# Patient Record
Sex: Male | Born: 1935 | Race: White | Hispanic: No | Marital: Married | Smoking: Never smoker
Health system: Southern US, Community
[De-identification: ages and names within clinical notes are randomized; demographics above are authoritative.]

## PROBLEM LIST (undated history)

## (undated) DIAGNOSIS — I341 Nonrheumatic mitral (valve) prolapse: Secondary | ICD-10-CM

## (undated) DIAGNOSIS — I10 Essential (primary) hypertension: Secondary | ICD-10-CM

## (undated) DIAGNOSIS — R55 Syncope and collapse: Secondary | ICD-10-CM

## (undated) DIAGNOSIS — I499 Cardiac arrhythmia, unspecified: Secondary | ICD-10-CM

## (undated) DIAGNOSIS — N189 Chronic kidney disease, unspecified: Secondary | ICD-10-CM

## (undated) DIAGNOSIS — N400 Enlarged prostate without lower urinary tract symptoms: Secondary | ICD-10-CM

## (undated) DIAGNOSIS — E785 Hyperlipidemia, unspecified: Secondary | ICD-10-CM

## (undated) HISTORY — DX: Nonrheumatic mitral (valve) prolapse: I34.1

## (undated) HISTORY — DX: Essential (primary) hypertension: I10

## (undated) HISTORY — DX: Hyperlipidemia, unspecified: E78.5

## (undated) HISTORY — PX: INSERT / REPLACE / REMOVE PACEMAKER: SUR710

## (undated) HISTORY — DX: Syncope and collapse: R55

## (undated) HISTORY — DX: Chronic kidney disease, unspecified: N18.9

## (undated) HISTORY — DX: Benign prostatic hyperplasia without lower urinary tract symptoms: N40.0

## (undated) HISTORY — DX: Cardiac arrhythmia, unspecified: I49.9

---

## 2009-05-15 ENCOUNTER — Encounter: Payer: Self-pay | Admitting: Internal Medicine

## 2009-10-28 ENCOUNTER — Encounter: Payer: Self-pay | Admitting: Internal Medicine

## 2010-03-06 ENCOUNTER — Encounter: Payer: Self-pay | Admitting: Internal Medicine

## 2010-03-17 ENCOUNTER — Encounter: Payer: Self-pay | Admitting: Internal Medicine

## 2010-04-02 ENCOUNTER — Ambulatory Visit: Payer: Self-pay | Admitting: Internal Medicine

## 2010-04-02 DIAGNOSIS — R55 Syncope and collapse: Secondary | ICD-10-CM

## 2010-04-02 DIAGNOSIS — I119 Hypertensive heart disease without heart failure: Secondary | ICD-10-CM

## 2010-04-06 ENCOUNTER — Ambulatory Visit: Payer: Self-pay | Admitting: Internal Medicine

## 2010-04-10 LAB — CONVERTED CEMR LAB
Basophils Absolute: 0 10*3/uL (ref 0.0–0.1)
CO2: 27 meq/L (ref 19–32)
Calcium: 9.1 mg/dL (ref 8.4–10.5)
Creatinine, Ser: 1.2 mg/dL (ref 0.4–1.5)
GFR calc non Af Amer: 61.79 mL/min (ref >60)
HCT: 29.6 % — ABNORMAL LOW (ref 39.0–52.0)
Hemoglobin: 10.2 g/dL — ABNORMAL LOW (ref 13.0–17.0)
INR: 1.1 — ABNORMAL HIGH (ref 0.8–1.0)
Lymphs Abs: 2.7 10*3/uL (ref 0.7–4.0)
MCHC: 34.6 g/dL (ref 30.0–36.0)
MCV: 93.4 fL (ref 78.0–100.0)
Monocytes Absolute: 0.6 10*3/uL (ref 0.1–1.0)
Monocytes Relative: 10.1 % (ref 3.0–12.0)
Neutro Abs: 2.4 10*3/uL (ref 1.4–7.7)
RDW: 14.6 % (ref 11.5–14.6)

## 2010-04-30 ENCOUNTER — Telehealth: Payer: Self-pay | Admitting: Internal Medicine

## 2010-05-18 ENCOUNTER — Encounter: Payer: Self-pay | Admitting: Internal Medicine

## 2010-06-11 ENCOUNTER — Telehealth: Payer: Self-pay | Admitting: Internal Medicine

## 2010-06-18 ENCOUNTER — Ambulatory Visit: Payer: Self-pay | Admitting: Internal Medicine

## 2010-06-22 ENCOUNTER — Encounter: Payer: Self-pay | Admitting: Internal Medicine

## 2010-06-29 ENCOUNTER — Ambulatory Visit: Payer: Self-pay | Admitting: Internal Medicine

## 2010-06-29 ENCOUNTER — Ambulatory Visit (HOSPITAL_COMMUNITY): Admission: RE | Admit: 2010-06-29 | Discharge: 2010-06-30 | Payer: Self-pay | Admitting: Internal Medicine

## 2010-07-03 ENCOUNTER — Encounter: Payer: Self-pay | Admitting: Internal Medicine

## 2010-07-09 ENCOUNTER — Encounter: Payer: Self-pay | Admitting: Internal Medicine

## 2010-07-09 ENCOUNTER — Ambulatory Visit: Payer: Self-pay

## 2010-10-06 ENCOUNTER — Encounter: Payer: Self-pay | Admitting: Internal Medicine

## 2010-10-06 ENCOUNTER — Ambulatory Visit
Admission: RE | Admit: 2010-10-06 | Discharge: 2010-10-06 | Payer: Self-pay | Source: Home / Self Care | Attending: Internal Medicine | Admitting: Internal Medicine

## 2010-10-06 DIAGNOSIS — Z950 Presence of cardiac pacemaker: Secondary | ICD-10-CM | POA: Insufficient documentation

## 2010-10-06 DIAGNOSIS — E785 Hyperlipidemia, unspecified: Secondary | ICD-10-CM | POA: Insufficient documentation

## 2010-10-20 NOTE — Cardiovascular Report (Signed)
Summary: Patient Implant Record Information   Patient Implant Record Information   Imported By: Roderic Ovens 07/20/2010 13:21:51  _____________________________________________________________________  External Attachment:    Type:   Image     Comment:   External Document

## 2010-10-20 NOTE — Assessment & Plan Note (Signed)
Summary: nep/irregular heartbeat/fainting spells/mt   CC:  new patient.  Irregular heart rate.  [facility]  History of Present Illness: Mr. Iiams is seen at the request of Arnette Felts because of recurrent syncope.  he is a 77 year old gentleman with a past history notable for hypertension who about 2 years ago began having abrupt episodes of syncope. It are occasionally associated with nausea and vomiting. They occurred occasionally upon standing. They also occur without warning. He is injured himself on a number of occasions. There is recovery fatigue  nausea and diaphoresis.  he has undergone extensive testing records of which were reviewed today. His echo showed normal left ventricular function. His Myoview showed normal perfusion and no scars. He also has been utilizing an MCXOT monitor which as demonstrated narrow QRS tachycardia probably AV nodal reentry, PVCs, sinus arrest with pauses of almost 5 seconds. He has had no tachycardia palpitations interestingly  He denies chest pain shortness of breath exercise tolerance or peripheral edema.  Current Medications (verified): 1)  Aspirin 81 Mg Tabs (Aspirin) .... Once Daily 2)  Amlodipine Besylate 5 Mg Tabs (Amlodipine Besylate) .... Take One Tablet Once Daily 3)  Niaspan 500 Mg Cr-Tabs (Niacin (Antihyperlipidemic)) .... Take 2 Tablets Every Evening 4)  Simvastatin 40 Mg Tabs (Simvastatin) .... Take One Tablet Every Evening 5)  Vitamin D3 1000 Unit Caps (Cholecalciferol) .... Once Daily 6)  Aspirin 81 Mg Tabs (Aspirin)  Allergies (verified): 1)  ! Pcn  Past History:  Past Medical History: Last updated: 04/01/2010 Syncope BPH hypertension atherosclerosis hyperlipidemia chronic kidney disease DM II mitral valve disorder dysrhythmia  Family History: Last updated: 04/02/2010 Negative FH of Diabetes, Hypertension, or Coronary Artery Disease  Social History: Last updated: 04/02/2010 Tobacco Use - No.  Alcohol Use - no married with  7 children  Family History: Negative FH of Diabetes, Hypertension, or Coronary Artery Disease  Social History: Tobacco Use - No.  Alcohol Use - no married with 7 children  Review of Systems       full review of systems was negative apart from a history of present illness and past medical history.   Vital Signs:  Patient profile:   77 year old male Height:      68 inches Weight:      152 pounds BMI:     23.20 Pulse rate:   62 / minute Pulse rhythm:   regular BP sitting:   185 / 79  (left arm) Cuff size:   regular  Vitals Entered By: Judithe Modest CMA (April 02, 2010 9:58 AM)  Physical Exam  General:  Alert and oriented older African American male appearing his stated agein no acute distress. HEENT  normal but he is edentulous Neck veins were flat; carotids brisk and full without bruits. No lymphadenopathy. Back without kyphosis. Lungs clear. Heart sounds regular without murmurs or gallops. PMI nondisplaced. Abdomen soft with active bowel sounds without midline pulsation or hepatomegaly. Femoral pulses and distal pulses intact. Extremities were without clubbing cyanosis or edemaSkin warm and dry. Neurological exam grossly normal.  affect is engaging    EKG  Procedure date:  04/02/2010  Findings:      sinus rhythm at 62 Intervals 0.22/0.10/0.43 Axis is -46 Borderline voltage criteria for LVH Diffuse T-wave flattening  Impression & Recommendations:  Problem # 1:  SYNCOPE (ICD-780.2) The patient has recurrent syncope abrupt in onset with some significant recovery epi phenomenon which suggest a neurally mediated syndrome. However, the patient also has by the recording sinus node dysfunction  it does not seem to be autonomic in that there is no preceding PP prolongation suggestive of a primary sinus node disorder. The abruptness of the syncope would also be consistent with fat. Markedly also demonstrates narrow QRS tachycardia that appears to be AV node reentry. Abrupt  ventricular underfilling associated with onset of tachycardia can't provoke a neurally mediated syncope. However is further complicated by the fact that the patient has had no significant symptoms of wearing the aforementioned monitor. He raises the possibility that neither the above is the mechanism of syncope although both could be.  I have tried to explain the lack of certainty about the monitor and its implications with the patient. We have decided to pursue a vigilant course for the next couple of weeks while he is wearing his monitor and see if syncope is associated with one or the other mechanisms in the event that it recurs. In the event that it does not I think proceeding with catheter ablation as well as backup bradycardia pacing as a double procedure is reasonable given the aforementioned monitoring observations and the patient's recurrent syncope  I reviewed with the patient the potential benefits and risks of both the ablation procedure as follows the pacemaker procedure including but not limited to death heart block perforation he understands these risks and is willing to proceed The following medications were removed from the medication list:    Carvedilol 25 Mg Tabs (Carvedilol) .[facility]... Take one tablet two times a day His updated medication list for this problem includes:    Aspirin 81 Mg Tabs (Aspirin) ..... Once daily    Amlodipine Besylate 5 Mg Tabs (Amlodipine besylate) .Craig Cross... Take one tablet once daily    Aspirin 81 Mg Tabs (Aspirin)  Problem # 2:  HYPERTENSION, HEART CONTROLLED W/O ASSOC CHF (ICD-402.10) his hypertension is poorly controlled. However for right now I would like to hold off on any other medication at this point although notwithstanding his creatinine being a little bit elevated, and ACE inhibitor and/or diuretic might be his best next drug The following medications were removed from the medication list:    Carvedilol 25 Mg Tabs (Carvedilol) .Craig Cross... Take one tablet two  times a day His updated medication list for this problem includes:    Aspirin 81 Mg Tabs (Aspirin) ..... Once daily    Amlodipine Besylate 5 Mg Tabs (Amlodipine besylate) .Craig Cross... Take one tablet once daily    Aspirin 81 Mg Tabs (Aspirin)  Problem # 3:  SINUS NODE DYSFUNCTION (ICD-427.81) see above discussion The following medications were removed from the medication list:    Carvedilol 25 Mg Tabs (Carvedilol) .Craig Cross... Take one tablet two times a day His updated medication list for this problem includes:    Aspirin 81 Mg Tabs (Aspirin) ..... Once daily    Amlodipine Besylate 5 Mg Tabs (Amlodipine besylate) .Craig Cross... 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 Take one tablet two times a day His updated medication list for this problem includes:    Aspirin 81 Mg Tabs (Aspirin) ..... Once daily    Amlodipine Besylate 5 Mg Tabs (Amlodipine besylate) .Craig Cross... Take one tablet once daily    Aspirin 81 Mg Tabs (Aspirin)  Patient Instructions: 1)  Your physician has recommended that you have an ablation.  Catheter ablation is a medical  procedure used to treat some cardiac arrhythmias (irregular heartbeats). During catheter ablation, a long, thin, flexible tube is put into a blood vessel in your groin (upper thigh), or neck. This tube is called an ablation catheter. It is then guided to your heart through the blood vessel. Radiofrequency waves destroy small areas of heart tissue where abnormal heartbeats may cause an arrhythmia to start.  Please see the instruction sheet given to you today. 2)  Your physician has recommended that you have a pacemaker inserted.  A pacemaker is a small device that is placed under the skin of your chest or abdomen to help control abnormal heart rhythms. This device uses electrical pulses to prompt  the heart to beat at a normal rate. Pacemakers are used to treat heart rhythms that are too slow. Wires (leads) are attached to the pacemaker that goes into the chambers of your heart. This is done in the hospital and usually requires an overnight stay. Please see the instruction sheet given to you today for more information.

## 2010-10-20 NOTE — Progress Notes (Signed)
Summary: set up device appt  Phone Note From Other Clinic Call back at [facility] Phone [phone]   Summary of Call: Per [facility] pt wants to [facility] pacemaker inplantation. They are not sure why he didnt keep the appt. Please call pt to set this up.  Caller: nurse [facility] Initial call taken by: Edman Circle,  April 30, 2010 10:27 AM  Follow-up for Phone Call        [facility] Scherrie Bateman, LPN  May 01, 2010 12:35 PM PT AWARE AWAITING RETURN CALL FROM ERICA  AT [facility] Scherrie Bateman, LPN  May 01, 2010 4:25 PM erica from [place] will find out if pt's anemia had been addressed. Follow-up by: Scherrie Bateman, LPN,  May 11, 2010 8:45 AM  Additional Follow-up for Phone Call Additional follow up Details #1::        [facility]-returning call-[phone] ext [phone] Melissa Tatum  May 11, 2010 3:38 PM  PT AWARE WILL CALL MON MORNING TO RESCHEDULE PROCEDURE. Additional Follow-up by: Scherrie Bateman, LPN,  May 14, 2010 6:07 PM    Additional Follow-up for Phone Call Additional follow up Details #2::    [facility] Scherrie Bateman, LPN  May 18, 2010 9:37 AM   .  Appended Document: set up device appt PROCEDURE SCHEDULED PER DR Graciela Husbands NEEDS APPT PRIOR APPT MADE FOR 06/18/10 AT 8:30 PT AWARE.

## 2010-10-20 NOTE — Procedures (Signed)
Summary: [facility]  [facility]   Imported By: Cala Bradford Mesiemore 04/14/2010 14:00:13  _____________________________________________________________________  External Attachment:    Type:   Image     Comment:   External Document

## 2010-10-20 NOTE — Letter (Signed)
Summary: Implantable Device Instructions  Architectural technologist, Main Office  [address]   [place], [place] [address]   Phone: [phone]  Fax: [phone]      Implantable Device Instructions  You are scheduled for:  __X___ Permanent Transvenous Pacemaker AND SVT ablation _____ Implantable Cardioverter Defibrillator _____ Implantable Loop Recorder _____ Generator Change  on MONDAY, April 13, 2010 with Dr. Graciela Husbands.  1.  Please arrive at the Short Stay Center at [facility] at 7:30 AM on the day of your procedure.  2.  Do not eat or drink after midnight the night before your procedure.  3.  Complete lab work on April 06, 2010.  The lab at [facility] is open from 8:30 AM to 1:30 PM and from 2:30 PM to 5:00 PM.  The lab at [facility] is open from 7:30 AM to 5:30 PM.  You do not have to be fasting.  4.  Ok to take medications with water morning of procedure.  5.  Plan for an overnight stay.  Bring your insurance cards and a list of your medications.  6.  Wash your chest and neck with antibacterial soap (any brand) the evening before and the morning of your procedure.  Rinse well.  7.  Education material received:     Pacemaker __X___           ICD _____           Arrhythmia ___X__  *If you have ANY questions after you get home, please call the office [phone].  *Every attempt is made to prevent procedures from being rescheduled.  Due to the nauture of Electrophysiology, rescheduling can happen.  The physician is always aware and directs the staff when this occurs.
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
## 2010-10-20 NOTE — Medication Information (Signed)
Summary: Physician's Orders   Physician's Orders   Imported By: Roderic Ovens 06/30/2010 09:07:06  _____________________________________________________________________  External Attachment:    Type:   Image     Comment:   External Document

## 2010-10-20 NOTE — Cardiovascular Report (Signed)
Summary: Pre Op Orders   Pre Op Orders   Imported By: Roderic Ovens 07/01/2010 15:56:41  _____________________________________________________________________  External Attachment:    Type:   Image     Comment:   External Document

## 2010-10-20 NOTE — Letter (Signed)
Summary: [facility] Medical Office Visit Note   [facility] Visit Note   Imported By: Roderic Ovens 04/13/2010 14:54:51  _____________________________________________________________________  External Attachment:    Type:   Image     Comment:   External Document

## 2010-10-20 NOTE — Progress Notes (Signed)
  Phone Note Outgoing Call   Call placed by: rhonda Call placed to: Patient Details for Reason: reschedule ablation/pacer implant Summary of Call: lmfcb to reschedule from 10/5 to 10/10  Follow-up for Phone Call        pt called and confirmed they can reschedule for 10/10 @ 1230 Follow-up by: Claris Gladden RN,  June 11, 2010 12:14 PM

## 2010-10-20 NOTE — Letter (Signed)
Summary: Implantable Device Instructions  Architectural technologist, Main Office  [address]   [place], [place] [address]   Phone: [phone]  Fax: [phone]      Implantable Device Instructions  You are scheduled for:  ___x__ Permanent Transvenous Pacemaker with SVT Ablation   on June 29, 2010 at 12:30 pm with Dr. Graciela Husbands.  1.  Please arrive at the Short Stay Center at [facility] at 10:30 am on the day of your procedure.  2.  Do not eat or drink after 6:30 am the day of your procedure.  You may have a clear liquid breakfast before that time.  Clear liquids include broth, Ginger Ale, water, Sprite, black coffee, tea (no sugar), cranberry/grape/apple juice, popsicle from clear juices.  Take all meds with sips of clear liquids the morning of the procedure.   3.  Complete lab work the week of October 3rd with [facility] in [place].  They will contact you to set up a date and time. You do not have to be fasting.   4.  Plan for an overnight stay.  Bring your insurance cards and a list of your medications.  5.  Wash your chest and neck with antibacterial soap (any brand) the evening before and the morning of your procedure.  Rinse well.    *If you have ANY questions after you get home, please call the office [phone]. Claris Gladden, RN, BSN  *Every attempt is made to prevent procedures from being rescheduled.  Due to the nauture of Electrophysiology, rescheduling can happen.  The physician is always aware and directs the staff when this occurs.

## 2010-10-20 NOTE — Procedures (Signed)
Summary: wound check/mdt.amber   Current Medications (verified): 1)  Aspirin 81 Mg Tabs (Aspirin) .... Once Daily 2)  Niaspan 500 Mg Cr-Tabs (Niacin (Antihyperlipidemic)) .... Once Daily 3)  Simvastatin 40 Mg Tabs (Simvastatin) .... Take One Tablet Every Evening 4)  Vitamin D3 1000 Unit Caps (Cholecalciferol) .... Once Daily 5)  Losartan Potassium 100 Mg Tabs (Losartan Potassium) .... Once Daily 6)  Daily Multiple Vitamins  Tabs (Multiple Vitamin) .... Once Daily 7)  Diltiazem Hcl Cr 240 Mg Xr24h-Cap (Diltiazem Hcl) .... Take 1 Capsule By Mouth Once Daily  Allergies (verified): 1)  ! Pcn   PPM Specifications Following MD:  Sherryl Manges, MD     PPM Vendor:  Medtronic     PPM Model Number:  [id number]     PPM Serial Number:  [id number] PPM DOI:  06/29/2010     PPM Implanting MD:  Sherryl Manges, MD  Lead 1    Location: RA     DOI: 06/29/2010     Model #: [id number]     Serial #: [id number]     Status: active Lead 2    Location: RV     DOI: 06/29/2010     Model #: [id number]     Serial #: [id number]     Status: active  Magnet Response Rate:  BOL 85 ERI 65  Indications:  Sick sinus syndrome Syncope   PPM Follow Up Battery Voltage:  3.06 V       PPM Device Measurements Atrium  Amplitude: 1.1 mV, Impedance: 464 ohms, Threshold: 0.5 V at 0.4 msec Right Ventricle  Amplitude: 16.5 mV, Impedance: 528 ohms, Threshold: 0.5 V at 0.3 msec  Episodes MS Episodes:  0     Ventricular High Rate:  0     Atrial Pacing:  66.5%     Ventricular Pacing:  0.7%  Parameters Mode:  MVP     Lower Rate Limit:  60     Upper Rate Limit:  130 Paced AV Delay:  180     Sensed AV Delay:  150 Next Cardiology Appt Due:  09/21/2010 Tech Comments:  WOUND CHECK---STERI STRIPS REMOVED.  NO REDNESS OR SWELLING AT SITE.  NORMAL DEVICE FUNCTION.  NO CHANGES MADE.  PT FEELS GREAT WITH PACER IMPLANT.  ROV IN [place] W/SK. Craig Cross  July 09, 2010 2:56 PM

## 2010-10-20 NOTE — Cardiovascular Report (Signed)
Summary: Office Visit   Office Visit   Imported By: Roderic Ovens 07/20/2010 13:19:49  _____________________________________________________________________  External Attachment:    Type:   Image     Comment:   External Document

## 2010-10-20 NOTE — Miscellaneous (Signed)
Summary: Device preload  Clinical Lists Changes  Observations: Added new observation of PPM INDICATN: Sick sinus syndrome Syncope (07/03/2010 16:02) Added new observation of MAGNET RTE: BOL 85 ERI 65 (07/03/2010 16:02) Added new observation of PPMLEADSTAT2: active (07/03/2010 16:02) Added new observation of PPMLEADSER2: [id number] (07/03/2010 16:02) Added new observation of PPMLEADMOD2: 5086  (07/03/2010 16:02) Added new observation of PPMLEADLOC2: RV  (07/03/2010 16:02) Added new observation of PPMLEADSTAT1: active  (07/03/2010 16:02) Added new observation of PPMLEADSER1: [id number]  (07/03/2010 16:02) Added new observation of PPMLEADMOD1: 5086  (07/03/2010 16:02) Added new observation of PPMLEADLOC1: RA  (07/03/2010 16:02) Added new observation of PPM IMP MD: Sherryl Manges, MD  (07/03/2010 16:02) Added new observation of PPMLEADDOI2: 06/29/2010  (07/03/2010 16:02) Added new observation of PPMLEADDOI1: 06/29/2010  (07/03/2010 16:02) Added new observation of PPM DOI: 06/29/2010  (07/03/2010 16:02) Added new observation of PPM SERL#: [id number]  (07/03/2010 16:02) Added new observation of PPM MODL#: [id number]  (07/03/2010 95:18) Added new observation of PACEMAKERMFG: [employer]tronic  (07/03/2010 16:02) Added new observation of PACEMAKER MD: Sherryl Manges, MD  (07/03/2010 16:02)      PPM Specifications Following MD:  Sherryl Manges, MD     PPM Vendor:  [employer]     PPM Model Number:  [id number]     PPM Serial Number:  [id number] PPM DOI:  06/29/2010     PPM Implanting MD:  Sherryl Manges, MD  Lead 1    Location: RA     DOI: 06/29/2010     Model #: [id number]     Serial #: [id number]     Status: active Lead 2    Location: RV     DOI: 06/29/2010     Model #: [id number]     Serial #: [id number]     Status: active  Magnet Response Rate:  BOL 85 ERI 65  Indications:  Sick sinus syndrome Syncope

## 2010-10-22 NOTE — Cardiovascular Report (Signed)
Summary: Office Visit   Office Visit   Imported By: Roderic Ovens 10/12/2010 11:11:26  _____________________________________________________________________  External Attachment:    Type:   Image     Comment:   External Document

## 2010-10-22 NOTE — Assessment & Plan Note (Signed)
Summary: pacer check.[email or web address].amber   Visit Type:  PPM-Medtronic  CC:  no complaints.  History of Present Illness: Craig Cross is seen in follouwp for  recurrent syncope; a loop recorder implanted demonstrated sinus pauses and he underwent pacemaker implantation October 2011.  He also had by  [facility] monitor narrow QRS tachycardia probably AV nodal reentry, PVCs,  He denies chest pain shortness of breath exercise tolerance or peripheral edema.  He is feeling much improved since his pacemaker went in without episodes of lightheadedness or palpitations.       Problems Prior to Update: 1)  Hypertension, Heart Controlled w/o Assoc CHF  (ICD-402.10) 2)  Syncope  (ICD-780.2) 3)  Sinus Node Dysfunction  (ICD-427.81) 4)  Svt Prob Avnrt  (ICD-427.0)  Current Medications (verified): 1)  Aspirin 81 Mg Tabs (Aspirin) .... Once Daily 2)  Niaspan 500 Mg Cr-Tabs (Niacin (Antihyperlipidemic)) .... Once Daily 3)  Simvastatin 40 Mg Tabs (Simvastatin) .... Take One Tablet Every Evening 4)  Vitamin D3 1000 Unit Caps (Cholecalciferol) .... Once Daily 5)  Losartan Potassium 100 Mg Tabs (Losartan Potassium) .... Once Daily 6)  Daily Multiple Vitamins  Tabs (Multiple Vitamin) .... Once Daily 7)  Diltiazem Hcl Cr 240 Mg Xr24h-Cap (Diltiazem Hcl) .... Take 1 Capsule By Mouth Once Daily 8)  Amlodipine Besylate 5 Mg Tabs (Amlodipine Besylate) .... Take One Tablet By Mouth Daily  Allergies (verified): 1)  ! Pcn  Past History:  Past Medical History: Last updated: 04/01/2010 Syncope BPH hypertension atherosclerosis hyperlipidemia chronic kidney disease DM II mitral valve disorder dysrhythmia  Family History: Last updated: 04/02/2010 Negative FH of Diabetes, Hypertension, or Coronary Artery Disease  Social History: Last updated: 04/02/2010 Tobacco Use - No.  Alcohol Use - no married with 7 children  Risk Factors: Smoking Status: never (04/01/2010)  Vital Signs:  Patient profile:   77  year old male Height:      68 inches Weight:      155.50 pounds BMI:     23.73 Pulse rate:   57 / minute BP sitting:   166 / 78  (left arm) Cuff size:   regular  Vitals Entered By: Caralee Ates CMA (October 06, 2010 9:15 AM)  Physical Exam  General:  The patient was alert and oriented in no acute distress. HEENT Normal.  Neck veins were flat, carotids were brisk.  Lungs were clear.  Heart sounds were regular without murmurs or gallops.  Abdomen was soft with active bowel sounds. There is no clubbing cyanosis or edema. Skin Warm and dry wound well-healed   PPM Specifications Following MD:  Sherryl Manges, MD     PPM Vendor:  Medtronic     PPM Model Number:  [id number]     PPM Serial Number:  [id number] PPM DOI:  06/29/2010     PPM Implanting MD:  Sherryl Manges, MD  Lead 1    Location: RA     DOI: 06/29/2010     Model #: [id number]     Serial #: [id number]     Status: active Lead 2    Location: RV     DOI: 06/29/2010     Model #: [id number]     Serial #: [id number]     Status: active  Magnet Response Rate:  BOL 85 ERI 65  Indications:  Sick sinus syndrome Syncope   PPM Follow Up Remote Check?  No Battery Voltage:  3.03 V     Pacer Dependent:  Yes       PPM Device  Measurements Atrium  Amplitude: 2.6 mV, Impedance: 544 ohms, Threshold: 0.5 V at 0.4 msec Right Ventricle  Amplitude: 12.8 mV, Impedance: 456 ohms, Threshold: 0.5 V at 0.4 msec  Episodes MS Episodes:  0     Percent Mode Switch:  0     Coumadin:  No Ventricular High Rate:  68     Atrial Pacing:  56.4%     Ventricular Pacing:  5.4%  Parameters Mode:  DDDR+     Lower Rate Limit:  60     Upper Rate Limit:  130 Paced AV Delay:  180     Sensed AV Delay:  150 Next Cardiology Appt Due:  06/21/2011 Tech Comments:  Outputs reprogrammed for chronic thresholds.  Rate response on today.  Patient activity level < 2 hours for 12 weeks although patient states he is  walking.  c/o some fatigue with exertion that subsides with rest and is able  to resume activity.  ROV 10/12 with Dr. Graciela Husbands. Craig Harm, Craig Cross  October 06, 2010 9:27 AM   Impression & Recommendations:  Problem # 1:  SINUS NODE DYSFUNCTION (ICD-427.81)  patient is paced without recurrent symptoms he is in the a-D. mode.he is atrially paced about 65% of the time His updated medication list for this problem includes:    Aspirin 81 Mg Tabs (Aspirin) ..... Once daily    Diltiazem Hcl Cr 240 Mg Xr24h-cap (Diltiazem hcl) .Marland Kitchen... Take 1 capsule by mouth once daily  His updated medication list for this problem includes:    Aspirin 81 Mg Tabs (Aspirin) ..... Once daily    Diltiazem Hcl Cr 240 Mg Xr24h-cap (Diltiazem hcl) .Marland Kitchen... Take 1 capsule by mouth once daily    Amlodipine Besylate 5 Mg Tabs (Amlodipine besylate) .Marland Kitchen... Take one tablet by mouth daily  Problem # 2:  SYNCOPE (ICD-780.2)  no recurrent syncope  His updated medication list for this problem includes:    Aspirin 81 Mg Tabs (Aspirin) ..... Once daily    Diltiazem Hcl Cr 240 Mg Xr24h-cap (Diltiazem hcl) .Marland Kitchen... Take 1 capsule by mouth once daily    Amlodipine Besylate 5 Mg Tabs (Amlodipine besylate) .Marland Kitchen... Take one tablet by mouth daily  Problem # 3:  SVT PROB AVNRT (ICD-427.0)  patient had a CT identified on his device. It does not appear to be AVNRT because the [place] interval is too long His updated medication list for this problem includes:    Aspirin 81 Mg Tabs (Aspirin) ..... Once daily    Diltiazem Hcl Cr 240 Mg Xr24h-cap (Diltiazem hcl) .Marland Kitchen... Take 1 capsule by mouth once daily  His updated medication list for this problem includes:    Aspirin 81 Mg Tabs (Aspirin) ..... Once daily    Diltiazem Hcl Cr 240 Mg Xr24h-cap (Diltiazem hcl) .Marland Kitchen... Take 1 capsule by mouth once daily    Amlodipine Besylate 5 Mg Tabs (Amlodipine besylate) .Marland Kitchen... Take one tablet by mouth daily  Problem # 4:  HYPERTENSION, HEART CONTROLLED W/O ASSOC CHF (ICD-402.10)  I will plan to change his llosartan-HCT. We'll stop his amlodipine as  is the duplicated calcium channel blocker.  Craig Cross had a metabolic profile blood pressure check in 2 weeks with Craig Cross His updated medication list for this problem includes:    Aspirin 81 Mg Tabs (Aspirin) ..... Once daily    Losartan Potassium-hctz 100-12.5 Mg Tabs (Losartan potassium-hctz) ..... Once daily    Diltiazem Hcl Cr 240 Mg Xr24h-cap (Diltiazem hcl) .Marland Kitchen... Take 1 capsule by mouth once daily  His updated medication list for this problem includes:    Aspirin 81 Mg Tabs (Aspirin) ..... Once daily    Losartan Potassium 100 Mg Tabs (Losartan potassium) ..... Once daily    Diltiazem Hcl Cr 240 Mg Xr24h-cap (Diltiazem hcl) .Marland Kitchen... Take 1 capsule by mouth once daily    Amlodipine Besylate 5 Mg Tabs (Amlodipine besylate) .Marland Kitchen... Take one tablet by mouth daily  Problem # 5:  PACEMAKER, PERMANENT (ICD-V45.01) Device parameters and data were reviewed and reprogramming was done to maximize longevity  Problem # 6:  HYPERLIPIDEMIA (ICD-272.4)  given the potential interactions between diltiazem and simvastatin, I have taken the liberty of stopping the simvastatin started on pravastatin His updated medication list for this problem includes:    Niaspan 500 Mg Cr-tabs (Niacin (antihyperlipidemic)) ..... Once daily    Pravastatin Sodium 40 Mg Tabs (Pravastatin sodium) ..... Once daily  His updated medication list for this problem includes:    Niaspan 500 Mg Cr-tabs (Niacin (antihyperlipidemic)) ..... Once daily    Pravastatin Sodium 40 Mg Tabs (Pravastatin sodium) ..... Once daily  Patient Instructions: 1)  Your physician has recommended you make the following change in your medication: Stop Amlodipine, Losartan, Simvastatin. Start Pravachol, Losartan/HCTZ.  2)  Your physician wants you to follow-up in:  October 2012 You will receive a reminder letter in the mail two months in advance. If you don't receive a letter, please call our office to schedule the follow-up appointment. 3)   Follow-up with Craig Cross in 2 weeks for a blood pressure check and a BMET.  Prescriptions: LOSARTAN POTASSIUM-HCTZ 100-12.5 MG TABS (LOSARTAN POTASSIUM-HCTZ) once daily  #30 x 11   Entered by:   Craig Gladden RN   Authorized by:   Craig May, MD, Craig Cross   Signed by:   Craig Gladden RN on 10/06/2010   Method used:   Electronically to        [facility] [phone]* (retail)       [address]       [place], [place]  [address]       Ph: [phone] or [phone]       Fax: [phone]   RxID:   [id number] PRAVASTATIN SODIUM 40 MG TABS (PRAVASTATIN SODIUM) once daily  #30 x 11   Entered by:   Craig Gladden RN   Authorized by:   Craig May, MD, [facility]   Signed by:   Craig Gladden RN on 10/06/2010   Method used:   Electronically to        [facility] [phone]* (retail)       [address]       [place], [place]  [address]       Ph: [phone] or [phone]       Fax: [phone]   RxID:   [id number]

## 2010-12-03 LAB — GLUCOSE, CAPILLARY: Glucose-Capillary: 71 mg/dL (ref 70–99)

## 2011-06-23 ENCOUNTER — Encounter: Payer: Self-pay | Admitting: Internal Medicine

## 2011-06-24 ENCOUNTER — Ambulatory Visit (INDEPENDENT_AMBULATORY_CARE_PROVIDER_SITE_OTHER): Payer: 59 | Admitting: Internal Medicine

## 2011-06-24 ENCOUNTER — Encounter: Payer: Self-pay | Admitting: Internal Medicine

## 2011-06-24 DIAGNOSIS — I495 Sick sinus syndrome: Secondary | ICD-10-CM

## 2011-06-24 DIAGNOSIS — I119 Hypertensive heart disease without heart failure: Secondary | ICD-10-CM

## 2011-06-24 DIAGNOSIS — Z950 Presence of cardiac pacemaker: Secondary | ICD-10-CM

## 2011-06-24 DIAGNOSIS — R55 Syncope and collapse: Secondary | ICD-10-CM

## 2011-06-24 LAB — PACEMAKER DEVICE OBSERVATION
AL IMPEDENCE PM: 368 Ohm
AL THRESHOLD: 0.5 V
RV LEAD THRESHOLD: 1 V

## 2011-06-24 NOTE — Assessment & Plan Note (Signed)
His blood pressure is elevated today but his PCP is followed closely.

## 2011-06-24 NOTE — Assessment & Plan Note (Signed)
The patient's device was interrogated.  The information was reviewed. No changes were made in the programming.    

## 2011-06-24 NOTE — Progress Notes (Signed)
   HPI  Craig Cross is a 77 y.o. male is seen in follouwp for recurrent syncope; a loop recorder implanted demonstrated sinus pauses and he underwent pacemaker implantation October 2011.  He also had by [facility] monitor narrow QRS tachycardia probably AV nodal reentry, PVCs,  He denies chest pain shortness of breath exercise tolerance or peripheral edema.  He is feeling much improved since his pacemaker went in without episodes of lightheadedness or palpitations    Past Medical History  Diagnosis Date  . Syncope and collapse   . BPH (benign prostatic hypertrophy)   . Hypertension   . Atherosclerosis   . Hyperlipidemia   . Chronic kidney disease     chronic  . Diabetes mellitus     type 2  . MVP (mitral valve prolapse)   . Dysrhythmia     Past Surgical History  Procedure Date  . Insert / replace / remove pacemaker     medtronic    Current Outpatient Prescriptions  Medication Sig Dispense Refill  . aspirin 81 MG tablet Take 81 mg by mouth daily.        . cholecalciferol (VITAMIN D) 1000 UNITS tablet Take 1,000 Units by mouth daily.        Marland Kitchen diltiazem (CARDIZEM CD) 240 MG 24 hr capsule Take 240 mg by mouth daily.        Marland Kitchen losartan-hydrochlorothiazide (HYZAAR) 100-12.5 MG per tablet 1 tablet Daily.      . Multiple Vitamin (MULTIVITAMIN) tablet Take 1 tablet by mouth daily.        . niacin (NIASPAN) 500 MG CR tablet Take 1,000 mg by mouth at bedtime.       . simvastatin (ZOCOR) 40 MG tablet Take 40 mg by mouth at bedtime.        . timolol (TIMOPTIC) 0.25 % ophthalmic solution 1 drop 2 (two) times daily.          Allergies  Allergen Reactions  . Penicillins     Review of Systems negative except from HPI and PMH  Physical Exam Well developed and well nourished in no acute distress HENT normal E scleral and icterus clear Neck Supple JVP flat; carotids brisk and full Clear to ausculation Regular rate and rhythm, no murmurs gallops or rub Soft with active bowel  sounds No clubbing cyanosis and edema Alert and oriented, grossly normal motor and sensory function Skin Warm and Dry   Assessment and  Plan

## 2011-06-24 NOTE — Assessment & Plan Note (Signed)
No recurrent syncope 

## 2011-06-24 NOTE — Assessment & Plan Note (Signed)
He is 59%  atrially paced

## 2011-06-24 NOTE — Patient Instructions (Signed)
Your physician wants you to follow-up in: 6 months with Craig Cross/Craig Cross for a device check & 1 year with Dr. Klein. You will receive a reminder letter in the mail two months in advance. If you don't receive a letter, please call our office to schedule the follow-up appointment.  Your physician recommends that you continue on your current medications as directed. Please refer to the Current Medication list given to you today.  

## 2011-11-08 IMAGING — CR DG CHEST 2V
2 series · 2 of 2 positions shown · non-contrast
Comparison: None

CLINICAL DATA: Supraventricular tachycardia.  Emphysema. Pre-op
respiratory exam.

CHEST - 2 VIEW

[view not recorded (1 of 2)]
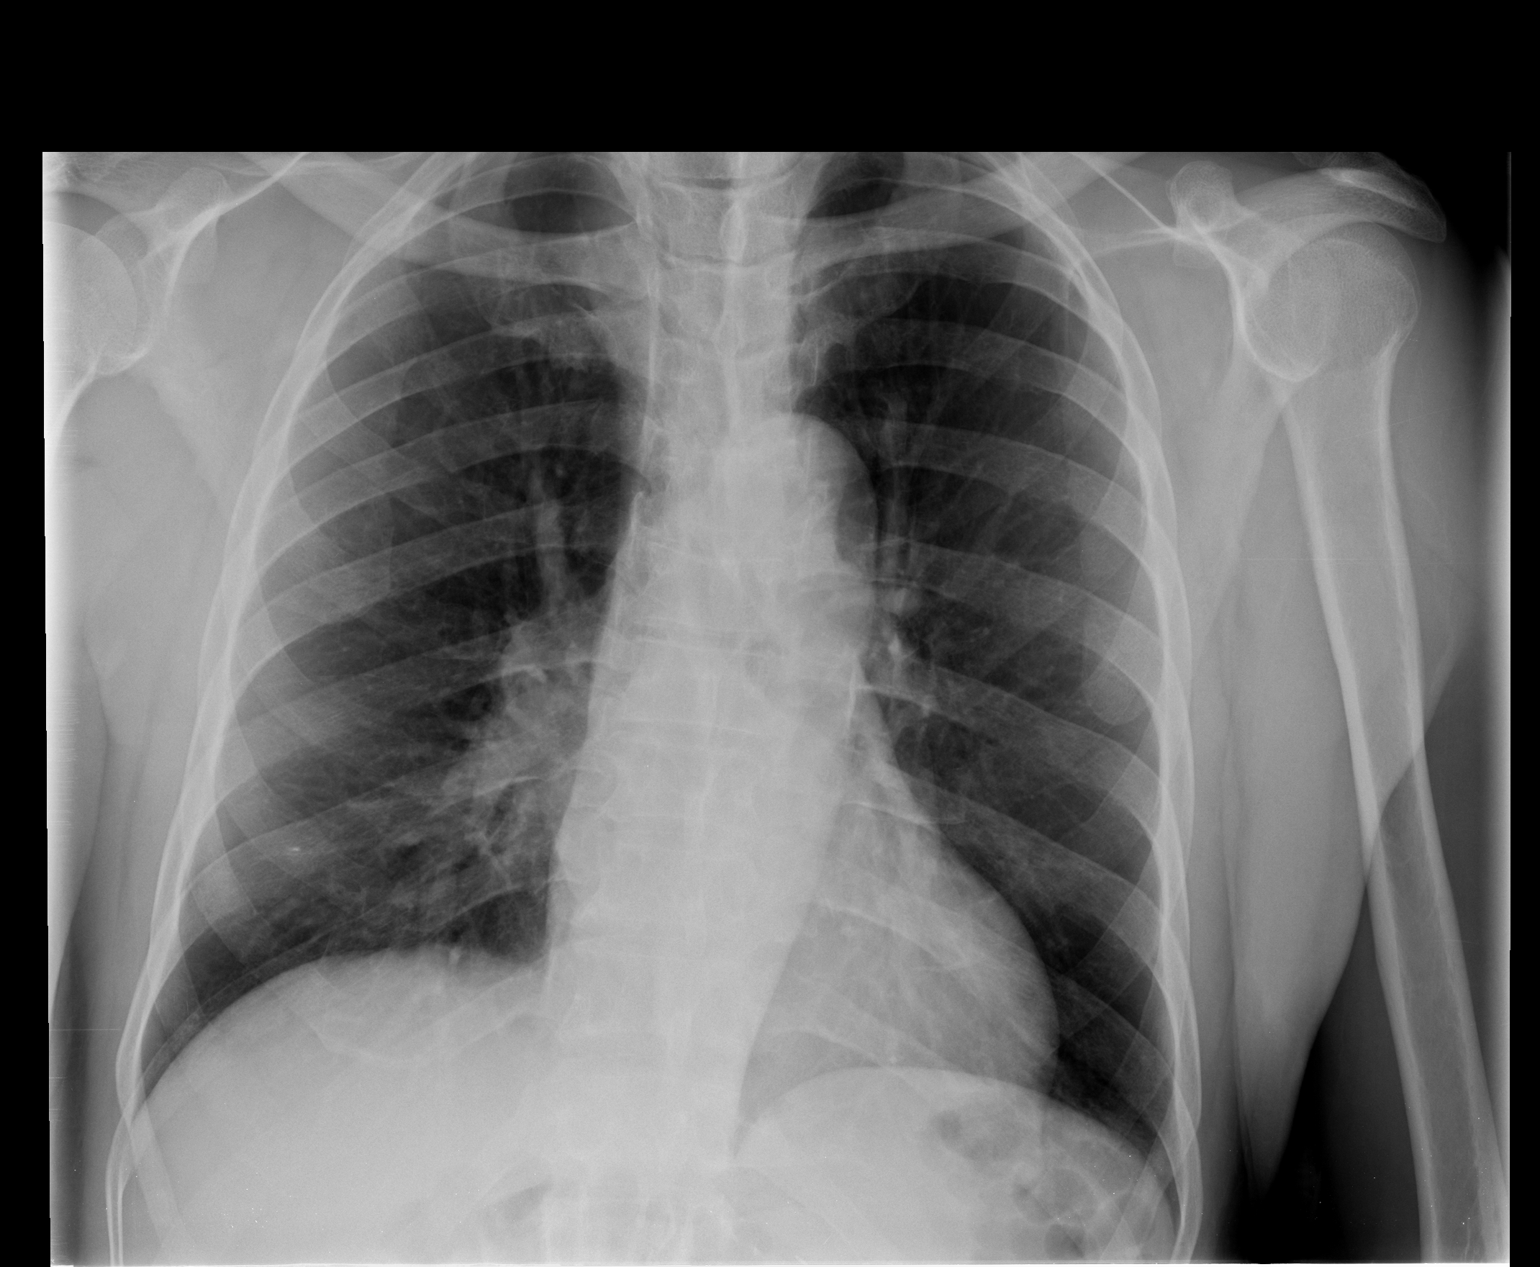

[view not recorded (2 of 2)]
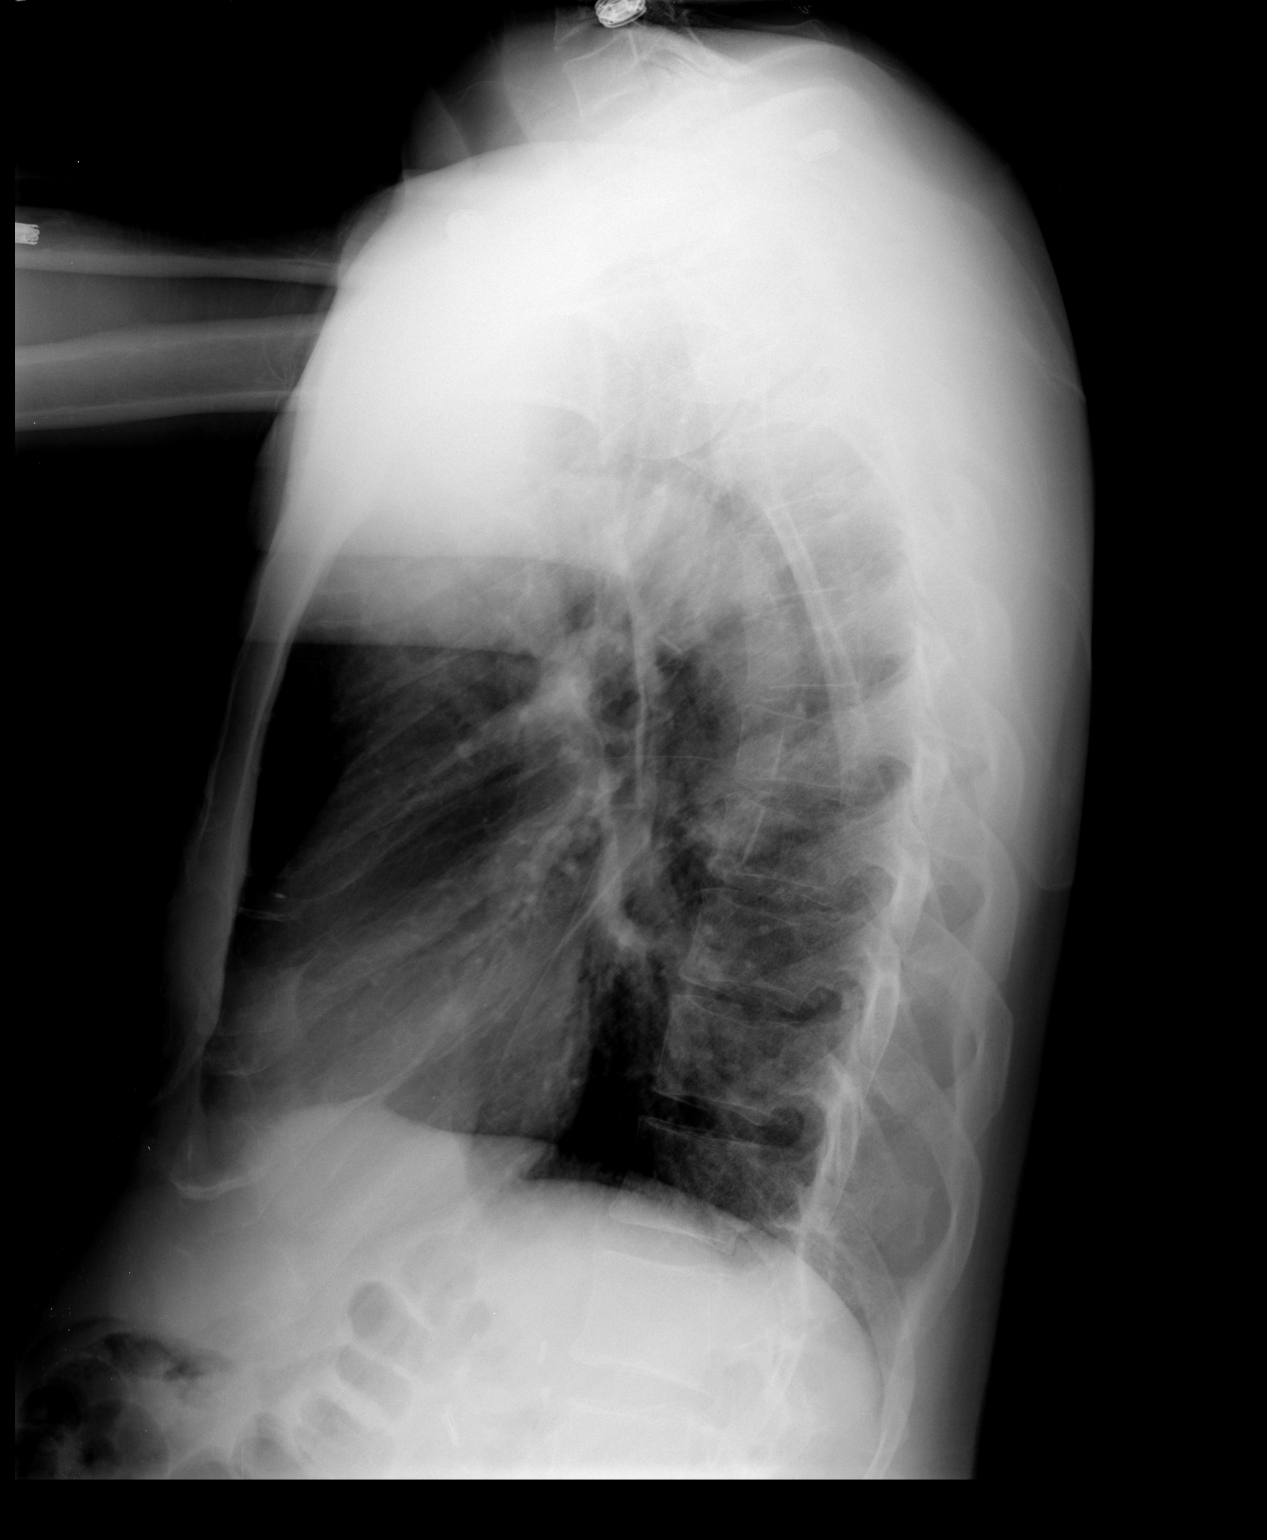

[2 of 2 positions shown; findings below may reference images not displayed]

FINDINGS: Heart size is within normal limits.  Both lungs are
clear.  No evidence of pleural effusion.  No mass or adenopathy
identified.
IMPRESSION: No active cardiopulmonary disease.

## 2011-12-22 ENCOUNTER — Ambulatory Visit (INDEPENDENT_AMBULATORY_CARE_PROVIDER_SITE_OTHER): Payer: 59 | Admitting: *Deleted

## 2011-12-22 ENCOUNTER — Encounter: Payer: Self-pay | Admitting: Internal Medicine

## 2011-12-22 DIAGNOSIS — R55 Syncope and collapse: Secondary | ICD-10-CM

## 2011-12-22 DIAGNOSIS — I495 Sick sinus syndrome: Secondary | ICD-10-CM

## 2011-12-22 DIAGNOSIS — Z950 Presence of cardiac pacemaker: Secondary | ICD-10-CM

## 2011-12-22 LAB — PACEMAKER DEVICE OBSERVATION
AL AMPLITUDE: 2.4 mv
AL THRESHOLD: 0.5 V
BAMS-0001: 150 {beats}/min
RV LEAD AMPLITUDE: 13.1 mv
RV LEAD THRESHOLD: 1 V

## 2011-12-22 NOTE — Progress Notes (Signed)
Pacer check in clinic  

## 2012-06-21 ENCOUNTER — Encounter: Payer: Self-pay | Admitting: *Deleted

## 2012-06-27 ENCOUNTER — Encounter: Payer: 59 | Admitting: Internal Medicine

## 2012-06-28 ENCOUNTER — Encounter: Payer: Self-pay | Admitting: Internal Medicine

## 2012-06-28 ENCOUNTER — Ambulatory Visit (INDEPENDENT_AMBULATORY_CARE_PROVIDER_SITE_OTHER): Payer: 59 | Admitting: Internal Medicine

## 2012-06-28 VITALS — BP 128/70 | HR 76 | Ht 68.0 in | Wt 151.8 lb

## 2012-06-28 DIAGNOSIS — I119 Hypertensive heart disease without heart failure: Secondary | ICD-10-CM

## 2012-06-28 DIAGNOSIS — I495 Sick sinus syndrome: Secondary | ICD-10-CM

## 2012-06-28 DIAGNOSIS — R55 Syncope and collapse: Secondary | ICD-10-CM

## 2012-06-28 DIAGNOSIS — Z950 Presence of cardiac pacemaker: Secondary | ICD-10-CM

## 2012-06-28 LAB — PACEMAKER DEVICE OBSERVATION
AL AMPLITUDE: 2.3 mv
AL IMPEDENCE PM: 384 Ohm
BATTERY VOLTAGE: 3 V
RV LEAD AMPLITUDE: 13 mv
RV LEAD IMPEDENCE PM: 432 Ohm

## 2012-06-28 NOTE — Assessment & Plan Note (Signed)
Stable post pacing. He paces about 60% of the time and otherwise misconduct with his PR interval of 350 ms but all without symptoms appear

## 2012-06-28 NOTE — Assessment & Plan Note (Signed)
The patient's device was interrogated.  The information was reviewed. No changes were made in the programming.    

## 2012-06-28 NOTE — Assessment & Plan Note (Signed)
Blood pressure well controlled

## 2012-06-28 NOTE — Patient Instructions (Addendum)
Remote monitoring is used to monitor your Pacemaker of ICD from home. This monitoring reduces the number of office visits required to check your device to one time per year. It allows us to keep an eye on the functioning of your device to ensure it is working properly. You are scheduled for a device check from home on October 02, 2012. You may send your transmission at any time that day. If you have a wireless device, the transmission will be sent automatically. After your physician reviews your transmission, you will receive a postcard with your next transmission date.  Your physician wants you to follow-up in: 1 year with Dr Klein.  You will receive a reminder letter in the mail two months in advance. If you don't receive a letter, please call our office to schedule the follow-up appointment.  

## 2012-06-28 NOTE — Assessment & Plan Note (Signed)
No recurrent syncope 

## 2012-06-28 NOTE — Progress Notes (Signed)
Patient Care Team: Provider Not In System as PCP - General   HPI  Craig Cross is a 78 y.o. male   seen in follouwp for recurrent syncope; a loop recorder implanted demonstrated sinus pauses and he underwent pacemaker implantation October 2011.   He also had by [facility] monitor narrow QRS tachycardia probably AV nodal reentry, PVCs,   Echocardiogram and Myoview were normal in 2011  The patient denies chest pain, shortness of breath, nocturnal dyspnea, orthopnea or peripheral edema.  There have been no palpitations, lightheadedness or syncope.       Past Medical History  Diagnosis Date  . Syncope and collapse   . BPH (benign prostatic hypertrophy)   . Hypertension   . Atherosclerosis   . Hyperlipidemia   . Chronic kidney disease     chronic  . Diabetes mellitus     type 2  . MVP (mitral valve prolapse)   . Dysrhythmia     Past Surgical History  Procedure Date  . Insert / replace / remove pacemaker     medtronic    Current Outpatient Prescriptions  Medication Sig Dispense Refill  . aspirin 81 MG tablet Take 81 mg by mouth daily.        . brimonidine (ALPHAGAN P) 0.1 % SOLN Place 1 drop into both eyes 2 (two) times daily.      Marland Kitchen diltiazem (CARDIZEM CD) 240 MG 24 hr capsule Take 240 mg by mouth daily.        Marland Kitchen diltiazem (DILACOR XR) 120 MG 24 hr capsule Take 120 mg by mouth daily.      . dorzolamide-timolol (COSOPT) 22.3-6.8 MG/ML ophthalmic solution Place 1 drop into both eyes 2 (two) times daily.      . fluticasone (FLONASE) 50 MCG/ACT nasal spray Place 2 sprays into the nose daily.      . Multiple Vitamin (MULTIVITAMIN) tablet Take 1 tablet by mouth daily.        . nebivolol (BYSTOLIC) 10 MG tablet Take 10 mg by mouth daily.      . niacin (NIASPAN) 500 MG CR tablet Take 500 mg by mouth at bedtime.       . rosuvastatin (CRESTOR) 10 MG tablet Take 10 mg by mouth daily.        Allergies  Allergen Reactions  . Penicillins     Review of Systems negative except  from HPI and PMH  Physical Exam BP 128/70  Pulse 76  Ht 5\' 8"  (1.727 m)  Wt 151 lb 12.8 oz (68.856 kg)  BMI 23.08 kg/m2 Well developed and well nourished in no acute distress HENT normal E scleral and icterus clear Neck Supple JVP flat; carotids brisk and full Clear to ausculation Device pocket well healed; without hematoma or erythema Regular rate and rhythm, no murmurs gallops or rub Soft with active bowel sounds No clubbing cyanosis none Edema Alert and oriented, grossly normal motor and sensory function Skin Warm and Dry  Electrocardiogram demonstrates AV pacing with occasional PACs that are tracked.  Assessment and  Plan

## 2012-10-02 ENCOUNTER — Encounter: Payer: 59 | Admitting: *Deleted

## 2012-10-09 ENCOUNTER — Encounter: Payer: Self-pay | Admitting: *Deleted

## 2012-10-19 ENCOUNTER — Encounter: Payer: Self-pay | Admitting: Internal Medicine

## 2012-10-19 ENCOUNTER — Ambulatory Visit (INDEPENDENT_AMBULATORY_CARE_PROVIDER_SITE_OTHER): Payer: 59 | Admitting: Cardiology

## 2012-10-19 ENCOUNTER — Encounter: Payer: Self-pay | Admitting: Cardiology

## 2012-10-19 DIAGNOSIS — Z950 Presence of cardiac pacemaker: Secondary | ICD-10-CM

## 2012-10-19 DIAGNOSIS — I495 Sick sinus syndrome: Secondary | ICD-10-CM

## 2012-10-19 LAB — PACEMAKER DEVICE OBSERVATION
AL IMPEDENCE PM: 376 Ohm
AL THRESHOLD: 1 V
ATRIAL PACING PM: 83
BATTERY VOLTAGE: 3.02 V

## 2012-10-19 NOTE — Progress Notes (Signed)
PPM check/device clinic only. Missed remote transmission. See PaceArt report.

## 2012-10-19 NOTE — Patient Instructions (Addendum)
Your physician wants you to follow-up in: October with Dr Graciela Husbands. You will receive a reminder letter in the mail two months in advance. If you don't receive a letter, please call our office to schedule the follow-up appointment.   Remote monitoring is used to monitor your Pacemaker of ICD from home. This monitoring reduces the number of office visits required to check your device to one time per year. It allows [place] to keep an eye on the functioning of your device to ensure it is working properly. You are scheduled for a device check from home on 01/11/13. You may send your transmission at any time that day. If you have a wireless device, the transmission will be sent automatically. After your physician reviews your transmission, you will receive a postcard with your next transmission date.

## 2013-01-08 ENCOUNTER — Encounter: Payer: 59 | Admitting: *Deleted

## 2013-01-19 ENCOUNTER — Encounter: Payer: Self-pay | Admitting: *Deleted

## 2013-01-24 ENCOUNTER — Ambulatory Visit (INDEPENDENT_AMBULATORY_CARE_PROVIDER_SITE_OTHER): Payer: 59 | Admitting: *Deleted

## 2013-01-24 DIAGNOSIS — Z950 Presence of cardiac pacemaker: Secondary | ICD-10-CM

## 2013-01-24 DIAGNOSIS — I495 Sick sinus syndrome: Secondary | ICD-10-CM

## 2013-02-01 LAB — REMOTE PACEMAKER DEVICE
AL IMPEDENCE PM: 376 Ohm
BAMS-0001: 150 {beats}/min
RV LEAD AMPLITUDE: 11.4 mv

## 2013-02-27 ENCOUNTER — Encounter: Payer: Self-pay | Admitting: *Deleted

## 2013-03-06 ENCOUNTER — Encounter: Payer: Self-pay | Admitting: Internal Medicine

## 2013-04-30 ENCOUNTER — Ambulatory Visit (INDEPENDENT_AMBULATORY_CARE_PROVIDER_SITE_OTHER): Payer: 59 | Admitting: *Deleted

## 2013-04-30 DIAGNOSIS — Z950 Presence of cardiac pacemaker: Secondary | ICD-10-CM

## 2013-04-30 DIAGNOSIS — I495 Sick sinus syndrome: Secondary | ICD-10-CM

## 2013-05-01 ENCOUNTER — Encounter: Payer: Self-pay | Admitting: Internal Medicine

## 2013-05-07 LAB — REMOTE PACEMAKER DEVICE
AL AMPLITUDE: 1.3 mv
AL IMPEDENCE PM: 384 Ohm
BATTERY VOLTAGE: 3 V
RV LEAD IMPEDENCE PM: 424 Ohm
VENTRICULAR PACING PM: 22.28

## 2013-06-05 ENCOUNTER — Encounter: Payer: Self-pay | Admitting: Internal Medicine

## 2013-06-08 ENCOUNTER — Encounter: Payer: Self-pay | Admitting: *Deleted

## 2013-06-19 ENCOUNTER — Ambulatory Visit (INDEPENDENT_AMBULATORY_CARE_PROVIDER_SITE_OTHER): Payer: 59 | Admitting: Cardiology

## 2013-06-19 ENCOUNTER — Encounter: Payer: Self-pay | Admitting: Internal Medicine

## 2013-06-19 ENCOUNTER — Encounter: Payer: Self-pay | Admitting: Cardiology

## 2013-06-19 VITALS — BP 168/58 | HR 63 | Ht 68.0 in | Wt 145.8 lb

## 2013-06-19 DIAGNOSIS — Z950 Presence of cardiac pacemaker: Secondary | ICD-10-CM

## 2013-06-19 DIAGNOSIS — I495 Sick sinus syndrome: Secondary | ICD-10-CM

## 2013-06-19 LAB — PACEMAKER DEVICE OBSERVATION
AL IMPEDENCE PM: 376 Ohm
ATRIAL PACING PM: 95.3
BATTERY VOLTAGE: 3 V
RV LEAD IMPEDENCE PM: 400 Ohm
VENTRICULAR PACING PM: 29.1

## 2013-06-19 NOTE — Patient Instructions (Addendum)
Your physician recommends that you schedule a follow-up appointment in: 3 MONTHS REMOTE CHECK 09-24-2013  Your physician wants you to follow-up in: 1 YEAR WITH DR. Logan Bores will receive a reminder letter in the mail two months in advance. If you don't receive a letter, please call our office to schedule the follow-up appointment.

## 2013-06-19 NOTE — Progress Notes (Signed)
ELECTROPHYSIOLOGY OFFICE NOTE  Patient ID: Craig Cross MRN: [id number], DOB/AGE: 04/25/1931   Date of Visit: 06/19/2013  Primary Physician: Provider Not In System Primary Cardiologist: Berton Mount, MD Reason for Visit: EP/device follow-up  History of Present Illness  Craig Cross is a 79 y.o. male with sinus node dysfunction s/p PPM implant, DM, CKD and HTN who presents today for routine electrophysiology followup. Since last being seen in our clinic, he reports he is doing well and has no complaints. He denies chest pain or shortness of breath. He denies palpitations, dizziness, near syncope or syncope. He denies LE swelling, orthopnea, PND or recent weight gain. He is compliant with medications and follow-up with his PCP.  Past Medical History Past Medical History  Diagnosis Date  . Syncope and collapse   . BPH (benign prostatic hypertrophy)   . Hypertension   . Atherosclerosis   . Hyperlipidemia   . Chronic kidney disease     chronic  . Diabetes mellitus     type 2  . MVP (mitral valve prolapse)   . Dysrhythmia     Past Surgical History Past Surgical History  Procedure Laterality Date  . Insert / replace / remove pacemaker      medtronic    Allergies/Intolerances Allergies  Allergen Reactions  . Penicillins     Current Home Medications Current Outpatient Prescriptions  Medication Sig Dispense Refill  . aspirin 81 MG tablet Take 81 mg by mouth daily.        . brimonidine (ALPHAGAN P) 0.1 % SOLN Place 1 drop into both eyes 2 (two) times daily.      Marland Kitchen diltiazem (DILACOR XR) 120 MG 24 hr capsule Take 120 mg by mouth daily.      . dorzolamide-timolol (COSOPT) 22.3-6.8 MG/ML ophthalmic solution Place 1 drop into both eyes 2 (two) times daily.      . fluticasone (FLONASE) 50 MCG/ACT nasal spray Place 2 sprays into the nose daily.      Marland Kitchen lisinopril (PRINIVIL,ZESTRIL) 10 MG tablet Take 10 mg by mouth daily.      . Multiple Vitamin (MULTIVITAMIN) tablet Take 1  tablet by mouth daily.        . nebivolol (BYSTOLIC) 10 MG tablet Take 10 mg by mouth daily.      . niacin (NIASPAN) 500 MG CR tablet Take 1,000 mg by mouth at bedtime.       . rosuvastatin (CRESTOR) 10 MG tablet Take 10 mg by mouth daily.       No current facility-administered medications for this visit.    Social History Social History  . Marital Status: Married   Social History Main Topics  . Smoking status: Never Smoker   . Smokeless tobacco: Not on file  . Alcohol Use: No  . Drug Use: No   Review of Systems General: No chills, fever, night sweats or weight changes Cardiovascular: No chest pain, dyspnea on exertion, edema, orthopnea, palpitations, paroxysmal nocturnal dyspnea Dermatological: No rash, lesions or masses Respiratory: No cough, dyspnea Urologic: No hematuria, dysuria Abdominal: No nausea, vomiting, diarrhea, bright red blood per rectum, melena, or hematemesis Neurologic: No visual changes, weakness, changes in mental status All other systems reviewed and are otherwise negative except as noted above.  Physical Exam Vitals: Blood pressure 168/58, pulse 63, height 5\' 8"  (1.727 m), weight 145 lb 12.8 oz (66.134 kg).  General: Well developed, well appearing 79 y.o. male in no acute distress. HEENT: Normocephalic, atraumatic. EOMs intact. Sclera nonicteric. Oropharynx clear.  Neck: Supple. No JVD. Lungs: Respirations regular and unlabored, CTA bilaterally. No wheezes, rales or rhonchi. Heart: RRR. S1, S2 present. No murmurs, rub, S3 or S4. Abdomen: Soft, non-distended.  Extremities: No clubbing, cyanosis or edema. PT/Radials 2+ and equal bilaterally. Psych: Normal affect. Neuro: Alert and oriented X 3. Moves all extremities spontaneously.   Diagnostics Device interrogation today - Normal device function. Thresholds, sensing, impedances consistent with previous measurements. Device programmed to maximize longevity. No mode switch episodes. 1 monitored VT episode and  3 fast A&V, longest 36 seconds, EGMs consistent with an atrial tachycardia with 1:1 conduction. No evidence of ventricular arrhythmias. Device programmed at appropriate safety margins. Histogram distribution appropriate for patient activity level.  Assessment and Plan 1. Sinus node dysfunction s/p PPM implant Normal device function No programming changes made Continue routine remote PPM follow-up every 3 months Return for follow-up with Dr. Graciela Husbands in one year 2. HTN BP followed by PCP with whom he has an upcoming appointment  Signed, Rick Duff, PA-C 06/19/2013, 3:50 PM

## 2013-06-29 ENCOUNTER — Encounter: Payer: 59 | Admitting: Cardiology

## 2013-07-03 ENCOUNTER — Encounter: Payer: 59 | Admitting: Internal Medicine

## 2013-09-24 ENCOUNTER — Ambulatory Visit (INDEPENDENT_AMBULATORY_CARE_PROVIDER_SITE_OTHER): Payer: 59 | Admitting: *Deleted

## 2013-09-24 DIAGNOSIS — I495 Sick sinus syndrome: Secondary | ICD-10-CM

## 2013-09-24 LAB — MDC_IDC_ENUM_SESS_TYPE_REMOTE
Battery Voltage: 3 V
Brady Statistic AP VS Percent: 56.19 %
Brady Statistic AS VP Percent: 0.21 %
Brady Statistic RA Percent Paced: 96.53 %
Brady Statistic RV Percent Paced: 40.55 %
Date Time Interrogation Session: 20150105210025
Lead Channel Sensing Intrinsic Amplitude: 12.7923
Lead Channel Setting Sensing Sensitivity: 0.9 mV
MDC IDC MSMT LEADCHNL RA IMPEDANCE VALUE: 376 Ohm
MDC IDC MSMT LEADCHNL RA SENSING INTR AMPL: 1.8037
MDC IDC MSMT LEADCHNL RV IMPEDANCE VALUE: 400 Ohm
MDC IDC SET LEADCHNL RA PACING AMPLITUDE: 2 V
MDC IDC SET LEADCHNL RV PACING AMPLITUDE: 2.5 V
MDC IDC SET LEADCHNL RV PACING PULSEWIDTH: 0.4 ms
MDC IDC STAT BRADY AP VP PERCENT: 40.34 %
MDC IDC STAT BRADY AS VS PERCENT: 3.26 %
Zone Setting Detection Interval: 400 ms
Zone Setting Detection Interval: 400 ms

## 2013-10-04 ENCOUNTER — Encounter: Payer: Self-pay | Admitting: *Deleted

## 2013-10-11 ENCOUNTER — Encounter: Payer: Self-pay | Admitting: Internal Medicine

## 2013-12-26 ENCOUNTER — Ambulatory Visit (INDEPENDENT_AMBULATORY_CARE_PROVIDER_SITE_OTHER): Payer: 59 | Admitting: *Deleted

## 2013-12-26 DIAGNOSIS — Z950 Presence of cardiac pacemaker: Secondary | ICD-10-CM

## 2013-12-26 DIAGNOSIS — R55 Syncope and collapse: Secondary | ICD-10-CM

## 2013-12-26 DIAGNOSIS — I495 Sick sinus syndrome: Secondary | ICD-10-CM

## 2013-12-26 LAB — MDC_IDC_ENUM_SESS_TYPE_REMOTE
Battery Voltage: 3 V
Brady Statistic AP VP Percent: 42.18 %
Brady Statistic AP VS Percent: 53.6 %
Brady Statistic AS VP Percent: 0.44 %
Brady Statistic RA Percent Paced: 95.78 %
Brady Statistic RV Percent Paced: 42.62 %
Date Time Interrogation Session: 20150408151110
Lead Channel Impedance Value: 400 Ohm
Lead Channel Setting Pacing Amplitude: 2.5 V
Lead Channel Setting Sensing Sensitivity: 0.9 mV
MDC IDC MSMT LEADCHNL RA IMPEDANCE VALUE: 368 Ohm
MDC IDC MSMT LEADCHNL RA SENSING INTR AMPL: 1.8 mV
MDC IDC MSMT LEADCHNL RV SENSING INTR AMPL: 13.8 mV
MDC IDC SET LEADCHNL RA PACING AMPLITUDE: 2 V
MDC IDC SET LEADCHNL RV PACING PULSEWIDTH: 0.4 ms
MDC IDC SET ZONE DETECTION INTERVAL: 400 ms
MDC IDC STAT BRADY AS VS PERCENT: 3.78 %
Zone Setting Detection Interval: 400 ms

## 2014-01-17 ENCOUNTER — Encounter: Payer: Self-pay | Admitting: *Deleted

## 2014-01-30 ENCOUNTER — Encounter: Payer: Self-pay | Admitting: Internal Medicine

## 2014-04-01 ENCOUNTER — Ambulatory Visit (INDEPENDENT_AMBULATORY_CARE_PROVIDER_SITE_OTHER): Payer: 59 | Admitting: *Deleted

## 2014-04-01 DIAGNOSIS — I495 Sick sinus syndrome: Secondary | ICD-10-CM

## 2014-04-01 DIAGNOSIS — Z950 Presence of cardiac pacemaker: Secondary | ICD-10-CM

## 2014-04-02 ENCOUNTER — Telehealth: Payer: Self-pay | Admitting: Cardiology

## 2014-04-02 NOTE — Progress Notes (Signed)
Remote pacemaker transmission.   

## 2014-04-02 NOTE — Telephone Encounter (Signed)
Spoke with pt and reminded pt of remote transmission that is due today. Pt verbalized understanding.   

## 2014-04-03 LAB — MDC_IDC_ENUM_SESS_TYPE_REMOTE
Battery Voltage: 2.99 V
Brady Statistic AP VP Percent: 19.74 %
Brady Statistic AS VP Percent: 0.74 %
Brady Statistic RV Percent Paced: 20.48 %
Date Time Interrogation Session: 20150714165818
Lead Channel Impedance Value: 368 Ohm
Lead Channel Sensing Intrinsic Amplitude: 1.8466
Lead Channel Setting Pacing Amplitude: 2 V
Lead Channel Setting Pacing Pulse Width: 0.4 ms
MDC IDC MSMT LEADCHNL RV IMPEDANCE VALUE: 392 Ohm
MDC IDC MSMT LEADCHNL RV SENSING INTR AMPL: 11.4458
MDC IDC SET LEADCHNL RV PACING AMPLITUDE: 2.5 V
MDC IDC SET LEADCHNL RV SENSING SENSITIVITY: 0.9 mV
MDC IDC SET ZONE DETECTION INTERVAL: 400 ms
MDC IDC STAT BRADY AP VS PERCENT: 75 %
MDC IDC STAT BRADY AS VS PERCENT: 4.52 %
MDC IDC STAT BRADY RA PERCENT PACED: 94.74 %
Zone Setting Detection Interval: 400 ms

## 2014-04-24 ENCOUNTER — Encounter: Payer: Self-pay | Admitting: Cardiology

## 2014-04-29 ENCOUNTER — Encounter: Payer: Self-pay | Admitting: Internal Medicine

## 2014-07-23 ENCOUNTER — Ambulatory Visit (INDEPENDENT_AMBULATORY_CARE_PROVIDER_SITE_OTHER): Payer: 59 | Admitting: Internal Medicine

## 2014-07-23 ENCOUNTER — Encounter: Payer: Self-pay | Admitting: Internal Medicine

## 2014-07-23 VITALS — BP 124/76 | HR 61 | Ht 70.0 in | Wt 146.0 lb

## 2014-07-23 DIAGNOSIS — Z950 Presence of cardiac pacemaker: Secondary | ICD-10-CM

## 2014-07-23 DIAGNOSIS — Z45018 Encounter for adjustment and management of other part of cardiac pacemaker: Secondary | ICD-10-CM | POA: Diagnosis not present

## 2014-07-23 DIAGNOSIS — I495 Sick sinus syndrome: Secondary | ICD-10-CM | POA: Diagnosis not present

## 2014-07-23 LAB — MDC_IDC_ENUM_SESS_TYPE_INCLINIC
Battery Voltage: 2.99 V
Brady Statistic AP VS Percent: 55.78 %
Brady Statistic AS VS Percent: 3.63 %
Brady Statistic RA Percent Paced: 95.64 %
Brady Statistic RV Percent Paced: 40.6 %
Date Time Interrogation Session: 20151103173627
Lead Channel Impedance Value: 384 Ohm
Lead Channel Impedance Value: 408 Ohm
Lead Channel Pacing Threshold Pulse Width: 0.4 ms
Lead Channel Sensing Intrinsic Amplitude: 2.2 mV
Lead Channel Setting Pacing Amplitude: 2 V
Lead Channel Setting Pacing Pulse Width: 0.4 ms
Lead Channel Setting Sensing Sensitivity: 0.9 mV
MDC IDC MSMT LEADCHNL RA PACING THRESHOLD AMPLITUDE: 1 V
MDC IDC MSMT LEADCHNL RA PACING THRESHOLD PULSEWIDTH: 0.4 ms
MDC IDC MSMT LEADCHNL RV PACING THRESHOLD AMPLITUDE: 1 V
MDC IDC MSMT LEADCHNL RV SENSING INTR AMPL: 12.119 mV
MDC IDC SET LEADCHNL RV PACING AMPLITUDE: 2.5 V
MDC IDC STAT BRADY AP VP PERCENT: 39.86 %
MDC IDC STAT BRADY AS VP PERCENT: 0.74 %
Zone Setting Detection Interval: 400 ms
Zone Setting Detection Interval: 400 ms

## 2014-07-23 NOTE — Patient Instructions (Signed)
Your physician recommends that you continue on your current medications as directed. Please refer to the Current Medication list given to you today.  Remote monitoring is used to monitor your Pacemaker of ICD from home. This monitoring reduces the number of office visits required to check your device to one time per year. It allows us to keep an eye on the functioning of your device to ensure it is working properly. You are scheduled for a device check from home on 10/24/14. You may send your transmission at any time that day. If you have a wireless device, the transmission will be sent automatically. After your physician reviews your transmission, you will receive a postcard with your next transmission date.  Your physician wants you to follow-up in: 1 year with Dr. Graciela HusbandsKlein.  You will receive a reminder letter in the mail two months in advance. If you don't receive a letter, please call our office to schedule the follow-up appointment.

## 2014-07-23 NOTE — Progress Notes (Signed)
      Patient Care Team: Provider Not In System as PCP - General   HPI  Craig Cross is a 80 y.o. male Seen in follow-up for pacemaker implantation for sinus node dysfunction.  The patient denies chest pain, shortness of breath, nocturnal dyspnea, orthopnea or peripheral edema.  There have been no palpitations, lightheadedness or syncope.    Past Medical History  Diagnosis Date  . Syncope and collapse   . BPH (benign prostatic hypertrophy)   . Hypertension   . Atherosclerosis   . Hyperlipidemia   . Chronic kidney disease     chronic  . Diabetes mellitus     type 2  . MVP (mitral valve prolapse)   . Dysrhythmia     Past Surgical History  Procedure Laterality Date  . Insert / replace / remove pacemaker      medtronic    Current Outpatient Prescriptions  Medication Sig Dispense Refill  . aspirin 81 MG tablet Take 81 mg by mouth daily.      . brimonidine (ALPHAGAN P) 0.1 % SOLN Place 1 drop into both eyes 2 (two) times daily.    Marland Kitchen. diltiazem (DILACOR XR) 120 MG 24 hr capsule Take 120 mg by mouth daily.    . dorzolamide-timolol (COSOPT) 22.3-6.8 MG/ML ophthalmic solution Place 1 drop into both eyes 2 (two) times daily.    . fluticasone (FLONASE) 50 MCG/ACT nasal spray Place 2 sprays into the nose daily.    Marland Kitchen. lisinopril (PRINIVIL,ZESTRIL) 10 MG tablet Take 10 mg by mouth daily.    . Multiple Vitamin (MULTIVITAMIN) tablet Take 1 tablet by mouth daily.      . nebivolol (BYSTOLIC) 10 MG tablet Take 10 mg by mouth daily.    . niacin (NIASPAN) 500 MG CR tablet Take 1,000 mg by mouth at bedtime.     . rosuvastatin (CRESTOR) 10 MG tablet Take 10 mg by mouth daily.     No current facility-administered medications for this visit.    Allergies  Allergen Reactions  . Penicillins Other (See Comments)    Tired     Review of Systems negative except from HPI and PMH  Physical Exam BP 124/76 mmHg  Pulse 61  Ht 5\' 10"  (1.778 m)  Wt 146 lb (66.225 kg)  BMI 20.95  kg/m2 Well developed and well nourished in no acute distress HENT normal E scleral and icterus clear Neck Supple JVP flat; carotids brisk and full Clear to ausculation Device pocket well healed; without hematoma or erythema.  There is no tethering  *Regular rate and rhythm, no murmurs gallops or rub Soft with active bowel sounds No clubbing cyanosis  Edema Alert and oriented, grossly normal motor and sensory function Skin Warm and Dry  ECG  AV pacing  Assessment and  Plan  Sinus node dysfunction  Good heart rate excursion  Heart Block intermittent  Stable post apcing  Pacemaker  Medtronic  The patient's device was interrogated.  The information was reviewed. No changes were made in the programming.     Hyperlipidemia would recommend him stopping his niacin but will defer to PCP

## 2014-07-31 ENCOUNTER — Encounter: Payer: Self-pay | Admitting: Internal Medicine

## 2014-10-24 ENCOUNTER — Telehealth: Payer: Self-pay | Admitting: Cardiology

## 2014-10-24 ENCOUNTER — Ambulatory Visit (INDEPENDENT_AMBULATORY_CARE_PROVIDER_SITE_OTHER): Payer: 59 | Admitting: *Deleted

## 2014-10-24 DIAGNOSIS — I495 Sick sinus syndrome: Secondary | ICD-10-CM

## 2014-10-24 LAB — MDC_IDC_ENUM_SESS_TYPE_REMOTE
Brady Statistic AP VP Percent: 44.01 %
Brady Statistic AP VS Percent: 47.21 %
Brady Statistic AS VP Percent: 1.3 %
Brady Statistic RA Percent Paced: 91.22 %
Brady Statistic RV Percent Paced: 45.3 %
Date Time Interrogation Session: 20160204184925
Lead Channel Sensing Intrinsic Amplitude: 12.4557
Lead Channel Sensing Intrinsic Amplitude: 2.2331
Lead Channel Setting Sensing Sensitivity: 0.9 mV
MDC IDC MSMT BATTERY VOLTAGE: 2.99 V
MDC IDC MSMT LEADCHNL RA IMPEDANCE VALUE: 376 Ohm
MDC IDC MSMT LEADCHNL RV IMPEDANCE VALUE: 400 Ohm
MDC IDC SET LEADCHNL RA PACING AMPLITUDE: 2 V
MDC IDC SET LEADCHNL RV PACING AMPLITUDE: 2.5 V
MDC IDC SET LEADCHNL RV PACING PULSEWIDTH: 0.4 ms
MDC IDC STAT BRADY AS VS PERCENT: 7.49 %
Zone Setting Detection Interval: 400 ms
Zone Setting Detection Interval: 400 ms

## 2014-10-24 NOTE — Telephone Encounter (Signed)
Confirmed remote transmission w/ pt wife.   

## 2014-10-24 NOTE — Progress Notes (Signed)
Remote pacemaker transmission.   

## 2014-11-18 ENCOUNTER — Encounter: Payer: Self-pay | Admitting: Cardiology

## 2014-11-25 ENCOUNTER — Encounter: Payer: Self-pay | Admitting: Internal Medicine

## 2015-01-27 ENCOUNTER — Ambulatory Visit (INDEPENDENT_AMBULATORY_CARE_PROVIDER_SITE_OTHER): Payer: Medicare HMO | Admitting: *Deleted

## 2015-01-27 DIAGNOSIS — I495 Sick sinus syndrome: Secondary | ICD-10-CM | POA: Diagnosis not present

## 2015-01-27 NOTE — Progress Notes (Signed)
Remote pacemaker transmission.   

## 2015-01-31 LAB — CUP PACEART REMOTE DEVICE CHECK
Brady Statistic AP VP Percent: 47.39 %
Brady Statistic AP VS Percent: 47.51 %
Brady Statistic AS VS Percent: 4.09 %
Lead Channel Impedance Value: 368 Ohm
Lead Channel Impedance Value: 392 Ohm
Lead Channel Sensing Intrinsic Amplitude: 11.782 mV
Lead Channel Sensing Intrinsic Amplitude: 2.061 mV
Lead Channel Setting Sensing Sensitivity: 0.9 mV
MDC IDC MSMT BATTERY VOLTAGE: 2.99 V
MDC IDC SESS DTM: 20160509154608
MDC IDC SET LEADCHNL RA PACING AMPLITUDE: 2 V
MDC IDC SET LEADCHNL RV PACING AMPLITUDE: 2.5 V
MDC IDC SET LEADCHNL RV PACING PULSEWIDTH: 0.4 ms
MDC IDC SET ZONE DETECTION INTERVAL: 400 ms
MDC IDC STAT BRADY AS VP PERCENT: 1.01 %
MDC IDC STAT BRADY RA PERCENT PACED: 94.9 %
MDC IDC STAT BRADY RV PERCENT PACED: 48.4 %
Zone Setting Detection Interval: 400 ms

## 2015-02-10 ENCOUNTER — Encounter: Payer: Self-pay | Admitting: Cardiology

## 2015-02-13 ENCOUNTER — Encounter: Payer: Self-pay | Admitting: Internal Medicine

## 2015-04-30 ENCOUNTER — Encounter: Payer: Medicare HMO | Admitting: *Deleted

## 2015-04-30 ENCOUNTER — Telehealth: Payer: Self-pay | Admitting: Cardiology

## 2015-04-30 ENCOUNTER — Ambulatory Visit (INDEPENDENT_AMBULATORY_CARE_PROVIDER_SITE_OTHER): Payer: Medicare HMO | Admitting: *Deleted

## 2015-04-30 ENCOUNTER — Encounter: Payer: Self-pay | Admitting: Internal Medicine

## 2015-04-30 DIAGNOSIS — I495 Sick sinus syndrome: Secondary | ICD-10-CM

## 2015-04-30 LAB — CUP PACEART INCLINIC DEVICE CHECK
Battery Voltage: 2.99 V
Brady Statistic AP VS Percent: 45.09 %
Brady Statistic AS VS Percent: 5.33 %
Brady Statistic RV Percent Paced: 49.58 %
Date Time Interrogation Session: 20160810093421
Lead Channel Impedance Value: 392 Ohm
Lead Channel Impedance Value: 424 Ohm
Lead Channel Pacing Threshold Amplitude: 0.5 V
Lead Channel Pacing Threshold Pulse Width: 0.4 ms
Lead Channel Pacing Threshold Pulse Width: 0.4 ms
Lead Channel Sensing Intrinsic Amplitude: 13.4656
Lead Channel Setting Pacing Amplitude: 2.5 V
Lead Channel Setting Pacing Pulse Width: 0.4 ms
MDC IDC MSMT LEADCHNL RV PACING THRESHOLD AMPLITUDE: 1 V
MDC IDC SET LEADCHNL RA PACING AMPLITUDE: 2 V
MDC IDC SET LEADCHNL RV SENSING SENSITIVITY: 0.9 mV
MDC IDC SET ZONE DETECTION INTERVAL: 400 ms
MDC IDC STAT BRADY AP VP PERCENT: 47.54 %
MDC IDC STAT BRADY AS VP PERCENT: 2.04 %
MDC IDC STAT BRADY RA PERCENT PACED: 92.63 %
Zone Setting Detection Interval: 400 ms

## 2015-04-30 NOTE — Telephone Encounter (Signed)
LMOVM reminding pt to send remote transmission.   

## 2015-04-30 NOTE — Progress Notes (Signed)
Pacemaker check in clinic. Normal device function. Thresholds, sensing, impedances consistent with previous measurements. Device programmed to maximize longevity. No mode switches. 3 nst episodes--longest was 6 beats. 3 fast A&V episodes--longest was 12 seconds. Device programmed at appropriate safety margins. Histogram distribution appropriate for patient activity level. Device programmed to optimize intrinsic conduction. Battery voltage 2.99 V. Patient enrolled in remote follow-up and Wirex ordered for pt. ROV 08-05-15 @ 1430 with SK.

## 2015-05-01 ENCOUNTER — Encounter: Payer: Self-pay | Admitting: Cardiology

## 2015-08-05 ENCOUNTER — Encounter: Payer: Self-pay | Admitting: Internal Medicine

## 2015-08-05 ENCOUNTER — Ambulatory Visit (INDEPENDENT_AMBULATORY_CARE_PROVIDER_SITE_OTHER): Payer: Medicare HMO | Admitting: Internal Medicine

## 2015-08-05 VITALS — BP 140/66 | HR 69 | Ht 68.0 in | Wt 145.8 lb

## 2015-08-05 DIAGNOSIS — Z950 Presence of cardiac pacemaker: Secondary | ICD-10-CM

## 2015-08-05 DIAGNOSIS — I495 Sick sinus syndrome: Secondary | ICD-10-CM

## 2015-08-05 DIAGNOSIS — I4430 Unspecified atrioventricular block: Secondary | ICD-10-CM | POA: Diagnosis not present

## 2015-08-05 NOTE — Patient Instructions (Addendum)
Medication Instructions: - no changes  Labwork: - none  Procedures/Testing: - none  Follow-Up: - Remote monitoring is used to monitor your Pacemaker of ICD from home. This monitoring reduces the number of office visits required to check your device to one time per year. It allows us to keep an eye on the functioning of your device to ensure it is working properly. You are scheduled for a device check from home on 11/04/15. You may send your transmission at any time that day. If you have a wireless device, the transmission will be sent automatically. After your physician reviews your transmission, you will receive a postcard with your next transmission date.  - Your physician wants you to follow-up in: 1 year with Dr. Graciela HusbandsKlein. You will receive a reminder letter in the mail two months in advance. If you don't receive a letter, please call our office to schedule the follow-up appointment.  Any Additional Special Instructions Will Be Listed Below (If Applicable). - none

## 2015-08-05 NOTE — Progress Notes (Signed)
      Patient Care Team: Provider Not In System as PCP - General   HPI  Craig Cross is a 81 y.o. male Seen in follow-up for pacemaker implantation for sinus node dysfunction.  The patient denies chest pain, shortness of breath, nocturnal dyspnea, orthopnea or peripheral edema.  There have been no palpitations, lightheadedness or syncope.   He had a fall a year ago at church. He had no recollection of the event.    Past Medical History  Diagnosis Date  . Syncope and collapse   . BPH (benign prostatic hypertrophy)   . Hypertension   . Atherosclerosis   . Hyperlipidemia   . Chronic kidney disease     chronic  . Diabetes mellitus     type 2  . MVP (mitral valve prolapse)   . Dysrhythmia     Past Surgical History  Procedure Laterality Date  . Insert / replace / remove pacemaker      medtronic    Current Outpatient Prescriptions  Medication Sig Dispense Refill  . atorvastatin (LIPITOR) 40 MG tablet Take 40 mg by mouth at bedtime.  5  . brimonidine (ALPHAGAN P) 0.1 % SOLN Place 1 drop into both eyes 2 (two) times daily.    . cloNIDine (CATAPRES) 0.2 MG tablet Take 0.2 mg by mouth 2 (two) times daily.  4  . diltiazem (DILACOR XR) 120 MG 24 hr capsule Take 120 mg by mouth daily.    . dorzolamide-timolol (COSOPT) 22.3-6.8 MG/ML ophthalmic solution Place 1 drop into both eyes 2 (two) times daily.    . fluticasone (FLONASE) 50 MCG/ACT nasal spray Place 2 sprays into the nose daily.    Marland Kitchen. lisinopril (PRINIVIL,ZESTRIL) 10 MG tablet Take 10 mg by mouth daily.    . Multiple Vitamin (MULTIVITAMIN) tablet Take 1 tablet by mouth daily.      . nebivolol (BYSTOLIC) 10 MG tablet Take 10 mg by mouth daily.    . niacin (NIASPAN) 500 MG CR tablet Take 1,000 mg by mouth at bedtime.      No current facility-administered medications for this visit.    Allergies  Allergen Reactions  . Penicillins Other (See Comments)    Tired     Review of Systems negative except from HPI and  PMH  Physical Exam BP 140/66 mmHg  Pulse 69  Ht 5\' 8"  (1.727 m)  Wt 145 lb 12.8 oz (66.134 kg)  BMI 22.17 kg/m2 Well developed and well nourished in no acute distress HENT normal E scleral and icterus clear Neck Supple JVP flat; carotids brisk and full Clear to ausculation Device pocket well healed; without hematoma or erythema.  There is no tethering  *Regular rate and rhythm, no murmurs gallops or rub Soft with active bowel sounds No clubbing cyanosis  Edema Alert and oriented, grossly normal motor and sensory function Skin Warm and Dry  ECG  AV pacing  Assessment and  Plan  Sinus node dysfunction  Good heart rate excursion  Heart Block intermittent     Pacemaker  Medtronic  The patient's device was interrogated.  The information was reviewed. No changes were made in the programming.       Stable

## 2015-08-16 LAB — CUP PACEART INCLINIC DEVICE CHECK
Battery Voltage: 2.97 V
Brady Statistic AP VP Percent: 22.25 %
Date Time Interrogation Session: 20161115204649
Implantable Lead Implant Date: 20111010
Implantable Lead Model: 5086
Lead Channel Pacing Threshold Amplitude: 0.5 V
Lead Channel Pacing Threshold Amplitude: 1 V
Lead Channel Sensing Intrinsic Amplitude: 1.546 mV
Lead Channel Setting Pacing Amplitude: 2.5 V
Lead Channel Setting Pacing Pulse Width: 0.4 ms
Lead Channel Setting Sensing Sensitivity: 0.9 mV
MDC IDC LEAD IMPLANT DT: 20111010
MDC IDC LEAD LOCATION: 753859
MDC IDC LEAD LOCATION: 753860
MDC IDC LEAD MODEL: 5086
MDC IDC MSMT LEADCHNL RA IMPEDANCE VALUE: 384 Ohm
MDC IDC MSMT LEADCHNL RA PACING THRESHOLD PULSEWIDTH: 0.4 ms
MDC IDC MSMT LEADCHNL RV IMPEDANCE VALUE: 416 Ohm
MDC IDC MSMT LEADCHNL RV PACING THRESHOLD PULSEWIDTH: 0.4 ms
MDC IDC MSMT LEADCHNL RV SENSING INTR AMPL: 10.773 mV
MDC IDC SET LEADCHNL RA PACING AMPLITUDE: 2 V
MDC IDC STAT BRADY AP VS PERCENT: 65.53 %
MDC IDC STAT BRADY AS VP PERCENT: 2.71 %
MDC IDC STAT BRADY AS VS PERCENT: 9.51 %
MDC IDC STAT BRADY RA PERCENT PACED: 87.77 %
MDC IDC STAT BRADY RV PERCENT PACED: 24.96 %

## 2015-11-04 ENCOUNTER — Telehealth: Payer: Self-pay | Admitting: Cardiology

## 2015-11-04 ENCOUNTER — Encounter: Payer: Medicare HMO | Admitting: *Deleted

## 2015-11-04 NOTE — Telephone Encounter (Signed)
Attempted to confirm remote transmission with pt. No answer and was unable to leave a message.   

## 2015-11-05 ENCOUNTER — Encounter: Payer: Self-pay | Admitting: Cardiology

## 2015-11-21 ENCOUNTER — Telehealth: Payer: Self-pay | Admitting: Internal Medicine

## 2015-11-21 NOTE — Telephone Encounter (Signed)
Spoke w/ pt and informed him that the wirex adapter we ordered for him in 07-2015 replaces his land line phone to make it wireless. Informed pt that if he needed help hooking up the monitor to the wirex to give us a call. Pt verbalized understanding.

## 2015-11-21 NOTE — Telephone Encounter (Signed)
Pt home phone have been disconnected,very concerned. Pt has a remote for his pacemaker,does not know how it will be monitored.

## 2016-09-20 DEATH — deceased
